# Patient Record
Sex: Female | Born: 1990 | Race: Black or African American | Hispanic: No | Marital: Single | Smoking: Never smoker
Health system: Southern US, Community
[De-identification: ages and names within clinical notes are randomized; demographics above are authoritative.]

## PROBLEM LIST (undated history)

## (undated) ENCOUNTER — Inpatient Hospital Stay (HOSPITAL_COMMUNITY): Payer: Self-pay

## (undated) DIAGNOSIS — Z789 Other specified health status: Secondary | ICD-10-CM

---

## 2018-01-07 ENCOUNTER — Encounter (HOSPITAL_COMMUNITY): Payer: Self-pay | Admitting: *Deleted

## 2018-01-07 ENCOUNTER — Inpatient Hospital Stay (HOSPITAL_COMMUNITY)
Admission: AD | Admit: 2018-01-07 | Discharge: 2018-01-07 | Disposition: A | Discharge status: Home / Self Care | Payer: Medicaid Other | Source: Ambulatory Visit | Attending: Obstetrics and Gynecology | Admitting: Obstetrics and Gynecology

## 2018-01-07 ENCOUNTER — Inpatient Hospital Stay (HOSPITAL_COMMUNITY): Payer: Medicaid Other

## 2018-01-07 DIAGNOSIS — O26893 Other specified pregnancy related conditions, third trimester: Secondary | ICD-10-CM | POA: Diagnosis present

## 2018-01-07 DIAGNOSIS — O4703 False labor before 37 completed weeks of gestation, third trimester: Secondary | ICD-10-CM

## 2018-01-07 DIAGNOSIS — R109 Unspecified abdominal pain: Secondary | ICD-10-CM | POA: Diagnosis present

## 2018-01-07 DIAGNOSIS — O26843 Uterine size-date discrepancy, third trimester: Secondary | ICD-10-CM

## 2018-01-07 DIAGNOSIS — Z3A31 31 weeks gestation of pregnancy: Secondary | ICD-10-CM | POA: Diagnosis not present

## 2018-01-07 DIAGNOSIS — O368390 Maternal care for abnormalities of the fetal heart rate or rhythm, unspecified trimester, not applicable or unspecified: Secondary | ICD-10-CM

## 2018-01-07 DIAGNOSIS — O479 False labor, unspecified: Secondary | ICD-10-CM

## 2018-01-07 HISTORY — DX: Other specified health status: Z78.9

## 2018-01-07 LAB — RAPID URINE DRUG SCREEN, HOSP PERFORMED
Amphetamines: NOT DETECTED
Benzodiazepines: NOT DETECTED
COCAINE: NOT DETECTED
OPIATES: NOT DETECTED
Tetrahydrocannabinol: NOT DETECTED

## 2018-01-07 LAB — URINALYSIS, ROUTINE W REFLEX MICROSCOPIC
BILIRUBIN URINE: NEGATIVE
Glucose, UA: NEGATIVE mg/dL
Hgb urine dipstick: NEGATIVE
KETONES UR: NEGATIVE mg/dL
NITRITE: NEGATIVE
Protein, ur: NEGATIVE mg/dL
SPECIFIC GRAVITY, URINE: 1.01 (ref 1.005–1.030)
pH: 7 (ref 5.0–8.0)

## 2018-01-07 NOTE — Discharge Instructions (Signed)
Braxton Hicks Contractions °Contractions of the uterus can occur throughout pregnancy, but they are not always a sign that you are in labor. You may have practice contractions called Braxton Hicks contractions. These false labor contractions are sometimes confused with true labor. °What are Braxton Hicks contractions? °Braxton Hicks contractions are tightening movements that occur in the muscles of the uterus before labor. Unlike true labor contractions, these contractions do not result in opening (dilation) and thinning of the cervix. Toward the end of pregnancy (32-34 weeks), Braxton Hicks contractions can happen more often and may become stronger. These contractions are sometimes difficult to tell apart from true labor because they can be very uncomfortable. You should not feel embarrassed if you go to the hospital with false labor. °Sometimes, the only way to tell if you are in true labor is for your health care provider to look for changes in the cervix. The health care provider will do a physical exam and may monitor your contractions. If you are not in true labor, the exam should show that your cervix is not dilating and your water has not broken. °If there are other health problems associated with your pregnancy, it is completely safe for you to be sent home with false labor. You may continue to have Braxton Hicks contractions until you go into true labor. °How to tell the difference between true labor and false labor °True labor °· Contractions last 30-70 seconds. °· Contractions become very regular. °· Discomfort is usually felt in the top of the uterus, and it spreads to the lower abdomen and low back. °· Contractions do not go away with walking. °· Contractions usually become more intense and increase in frequency. °· The cervix dilates and gets thinner. °False labor °· Contractions are usually shorter and not as strong as true labor contractions. °· Contractions are usually irregular. °· Contractions  are often felt in the front of the lower abdomen and in the groin. °· Contractions may go away when you walk around or change positions while lying down. °· Contractions get weaker and are shorter-lasting as time goes on. °· The cervix usually does not dilate or become thin. °Follow these instructions at home: °· Take over-the-counter and prescription medicines only as told by your health care provider. °· Keep up with your usual exercises and follow other instructions from your health care provider. °· Eat and drink lightly if you think you are going into labor. °· If Braxton Hicks contractions are making you uncomfortable: °? Change your position from lying down or resting to walking, or change from walking to resting. °? Sit and rest in a tub of warm water. °? Drink enough fluid to keep your urine pale yellow. Dehydration may cause these contractions. °? Do slow and deep breathing several times an hour. °· Keep all follow-up prenatal visits as told by your health care provider. This is important. °Contact a health care provider if: °· You have a fever. °· You have continuous pain in your abdomen. °Get help right away if: °· Your contractions become stronger, more regular, and closer together. °· You have fluid leaking or gushing from your vagina. °· You pass blood-tinged mucus (bloody show). °· You have bleeding from your vagina. °· You have low back pain that you never had before. °· You feel your baby’s head pushing down and causing pelvic pressure. °· Your baby is not moving inside you as much as it used to. °Summary °· Contractions that occur before labor are called Braxton   Hicks contractions, false labor, or practice contractions. °· Braxton Hicks contractions are usually shorter, weaker, farther apart, and less regular than true labor contractions. True labor contractions usually become progressively stronger and regular and they become more frequent. °· Manage discomfort from Braxton Hicks contractions by  changing position, resting in a warm bath, drinking plenty of water, or practicing deep breathing. °This information is not intended to replace advice given to you by your health care provider. Make sure you discuss any questions you have with your health care provider. °Document Released: 11/19/2016 Document Revised: 11/19/2016 Document Reviewed: 11/19/2016 °Elsevier Interactive Patient Education © 2018 [employer] ° °

## 2018-01-07 NOTE — MAU Note (Signed)
Pt C/O lower abdominal cramping since 1400, feel like needs to have BM but doesn't.  Denies bleeding or LOF.  Reports good fetal movement.

## 2018-01-07 NOTE — MAU Provider Note (Signed)
History     CSN: [id number]  Arrival date and time: 01/07/18 1640   First Provider Initiated Contact with Patient 01/07/18 1822      Chief Complaint  Patient presents with  . Abdominal Pain   HPI   [name] is a 27 y.o. female G2P1001 @ 2884w3d here in MAU with contractions. Says the contractions started today around 1400. Says she is feeling the contractions every 10 minutes. No bleeding or discharge. Says she has not taken anything for the pain. Says the contraction pain is 6-7/10. + active fetal movement. NO complications with this pregnancy. Getting her care at [facility], however plans to deliver in [place].   OB History    Gravida  2   Para  1   Term  1   Preterm      AB      Living  1     SAB      TAB      Ectopic      Multiple      Live Births              Past Medical History:  Diagnosis Date  . Medical history non-contributory     Past Surgical History:  Procedure Laterality Date  . CESAREAN SECTION      Family History  Problem Relation Age of Onset  . Diabetes Father   . Diabetes Maternal Aunt     Social History   Tobacco Use  . Smoking status: Never Smoker  . Smokeless tobacco: Never Used  Substance Use Topics  . Alcohol use: Never    Frequency: Never  . Drug use: Never    Allergies: No Known Allergies  No medications prior to admission.   Results for orders placed or performed during the hospital encounter of 01/07/18 (from the past 48 hour(s))  Urinalysis, Routine w reflex microscopic     Status: Abnormal   Collection Time: 01/07/18  5:10 PM  Result Value Ref Range   Color, Urine YELLOW YELLOW   APPearance HAZY (A) CLEAR   Specific Gravity, Urine 1.010 1.005 - 1.030   pH 7.0 5.0 - 8.0   Glucose, UA NEGATIVE NEGATIVE mg/dL   Hgb urine dipstick NEGATIVE NEGATIVE   Bilirubin Urine NEGATIVE NEGATIVE   Ketones, ur NEGATIVE NEGATIVE mg/dL   Protein, ur NEGATIVE NEGATIVE mg/dL   Nitrite NEGATIVE NEGATIVE   Leukocytes,  UA SMALL (A) NEGATIVE   RBC / HPF 0-5 0 - 5 RBC/hpf   WBC, UA 0-5 0 - 5 WBC/hpf   Bacteria, UA RARE (A) NONE SEEN   Squamous Epithelial / LPF 11-20 0 - 5    Comment: Performed at [facility], [address] [place], [place] [id number]  Urine rapid drug screen (hosp performed)     Status: Abnormal   Collection Time: 01/07/18  5:21 PM  Result Value Ref Range   Opiates NONE DETECTED NONE DETECTED   Cocaine NONE DETECTED NONE DETECTED   Benzodiazepines NONE DETECTED NONE DETECTED   Amphetamines NONE DETECTED NONE DETECTED   Tetrahydrocannabinol NONE DETECTED NONE DETECTED   Barbiturates (A) NONE DETECTED    Result not available. Reagent lot number recalled by manufacturer.    Comment: (NOTE) DRUG SCREEN FOR MEDICAL PURPOSES ONLY.  IF CONFIRMATION IS NEEDED FOR ANY PURPOSE, NOTIFY LAB WITHIN 5 DAYS. LOWEST DETECTABLE LIMITS FOR URINE DRUG SCREEN Drug Class  Cutoff (ng/mL) Amphetamine and metabolites    1000 Barbiturate and metabolites    200 Benzodiazepine                 200 Tricyclics and metabolites     300 Opiates and metabolites        300 Cocaine and metabolites        300 THC                            50 Performed at [facility], [address] [place], [place] [address]     Review of Systems  Constitutional: Negative for fever.  Gastrointestinal: Positive for abdominal pain.  Genitourinary: Negative for decreased urine volume, dysuria, vaginal bleeding and vaginal discharge.   Physical Exam   Blood pressure (!) 100/56, pulse 82, temperature 98.6 F (37 C), temperature source Oral, resp. rate 18, height 5\' 9"  (1.753 m), weight 168 lb (76.2 kg).  Physical Exam  Constitutional: She is oriented to person, place, and time. She appears well-developed and well-nourished. No distress.  HENT:  Head: Normocephalic.  Eyes: Pupils are equal, round, and reactive to light.  GI: Soft. She exhibits no distension. There is no tenderness. There is no  rebound and no guarding.  Genitourinary:  Genitourinary Comments: Cervix: closed, thick posterior, external os open, internal os closed  Musculoskeletal: Normal range of motion.  Neurological: She is alert and oriented to person, place, and time.  Skin: Skin is warm. She is not diaphoretic.  Psychiatric: Her behavior is normal.   Fetal Tracing: Baseline: Difficult to determine  Variability: Difficult to determine  Accelerations: Difficult to determine  Decelerations: None Toco: None   MAU Course  Procedures  None  MDM  Limited OB US UA Urine drug screen  Dr. Emelda Fear at bedside; bedside US done by Dr. Emelda Fear,  Irregularly irregular skipped beats in the fetal heart rate. Fetal heart rate by bedside US measured for 15 seconds at a time, 2 skipped beats noted per 15 seconds/8 per minuet.   Assessment and Plan   A:  1. Braxton Hicks contractions   2. [redacted] weeks gestation of pregnancy   3. Size of fetus inconsistent with dates in third trimester   4. Fetal arrhythmia affecting pregnancy, antepartum     P:  Discharge home in stable condition Close follow up with OB Return to MAU if symptoms worsen Pre-term labor precautions  Rasch, Harolyn Rutherford, NP 01/09/2018 8:57 PM

## 2018-05-31 ENCOUNTER — Encounter (HOSPITAL_COMMUNITY): Payer: Self-pay

## 2019-06-17 IMAGING — US US MFM OB LIMITED
1 series · 15 of 26 positions shown · non-contrast
Comparison: none

[Series 1: us mfm ob limited · 15 of 26 slices shown]
[im 1/26]
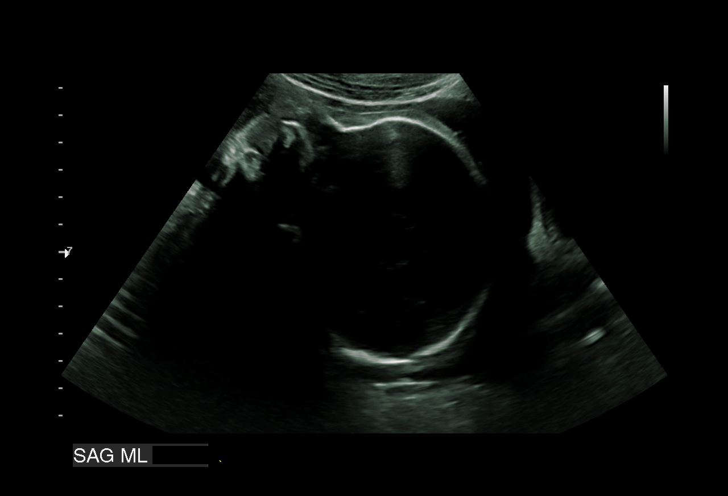
[im 3/26]
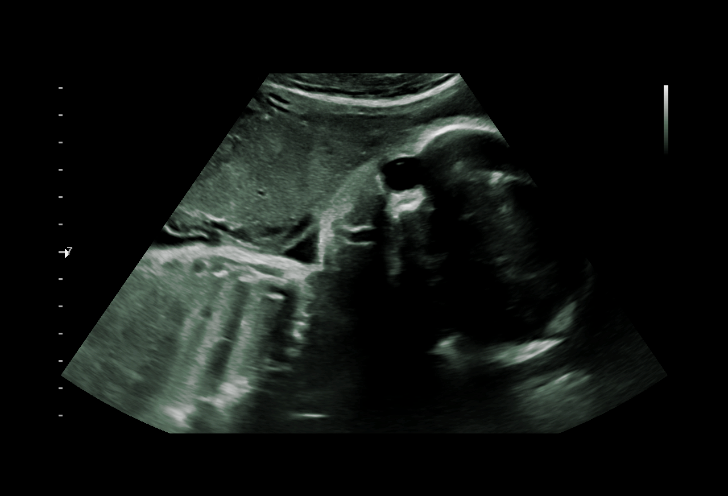
[im 5/26]
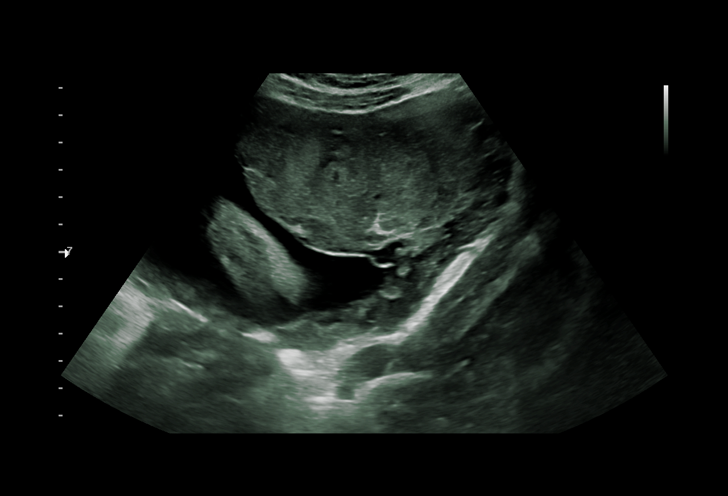
[im 7/26]
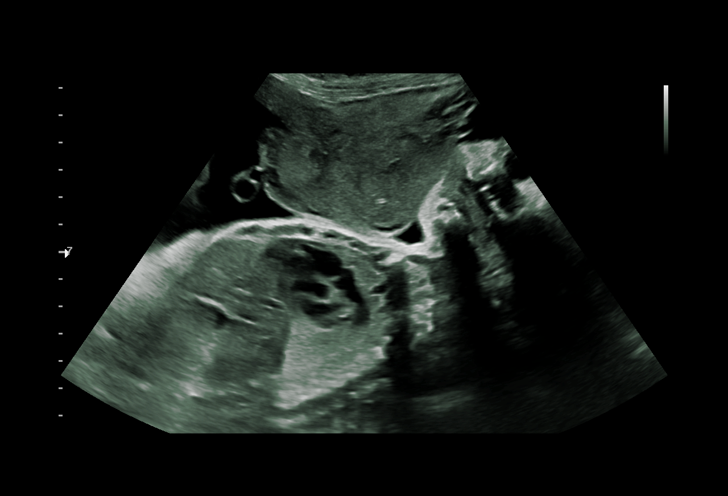
[im 8/26]
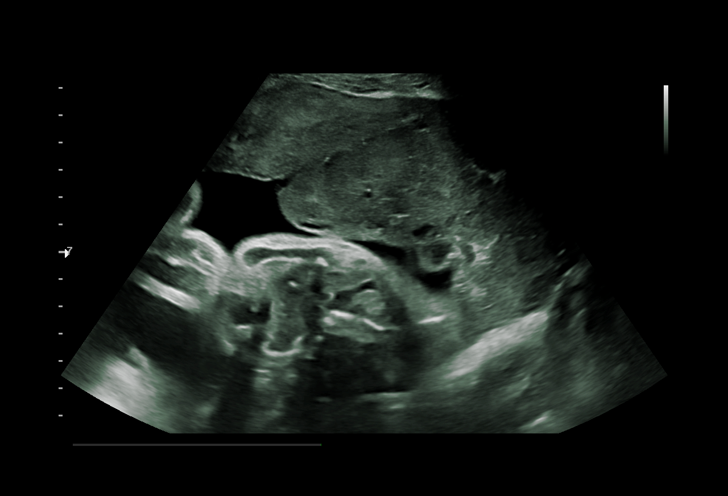
[im 10/26]
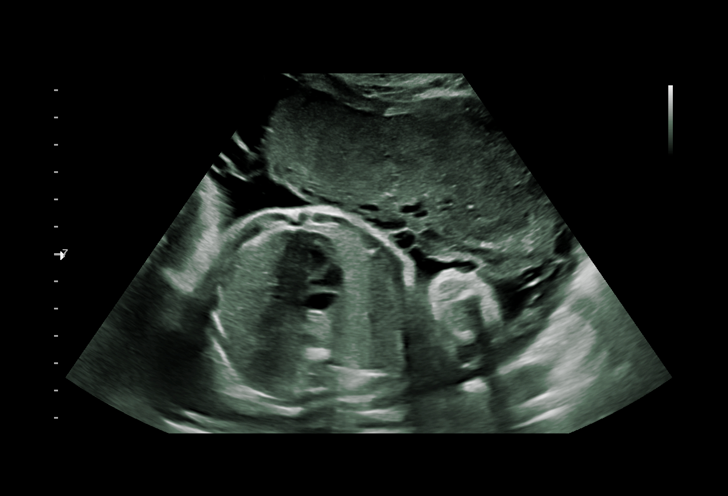
[im 12/26]
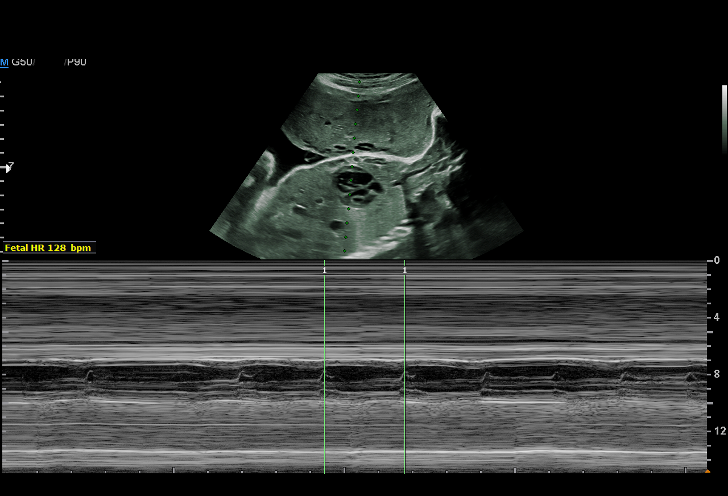
[im 14/26]
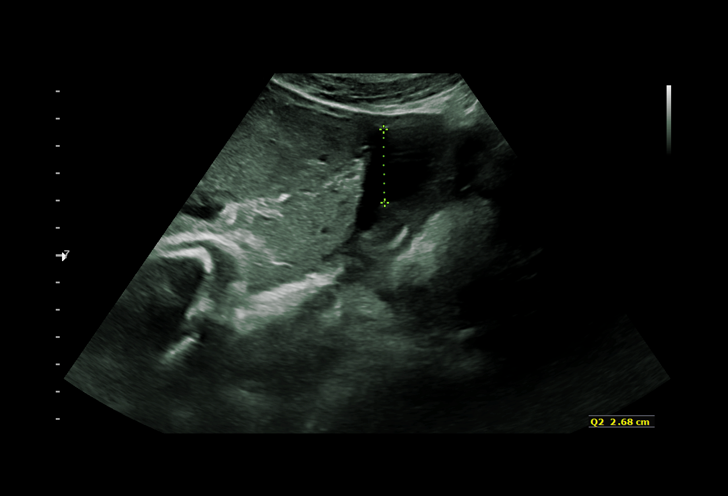
[im 15/26]
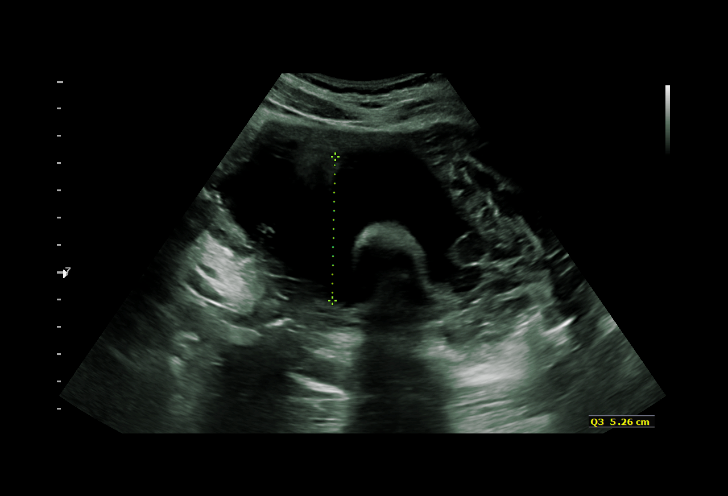
[im 17/26]
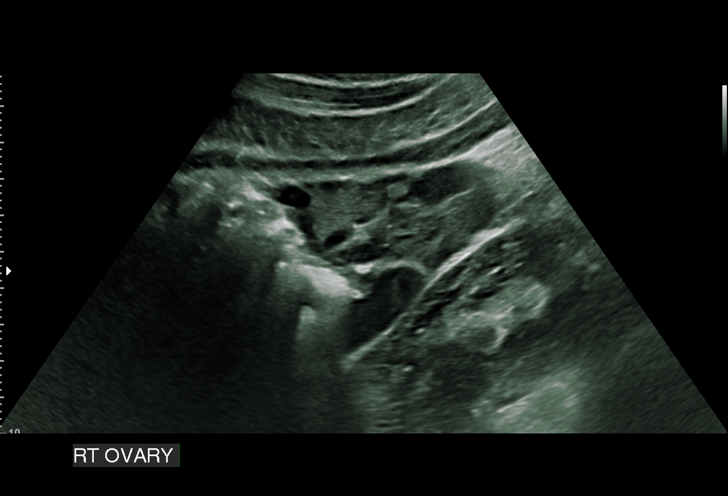
[im 19/26]
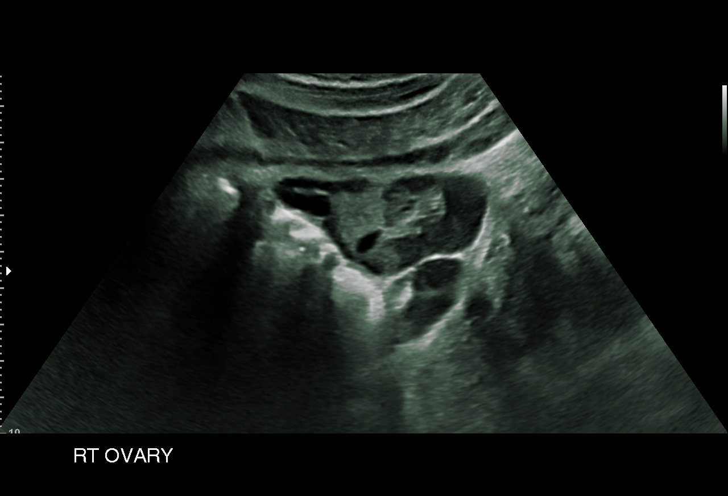
[im 20/26]
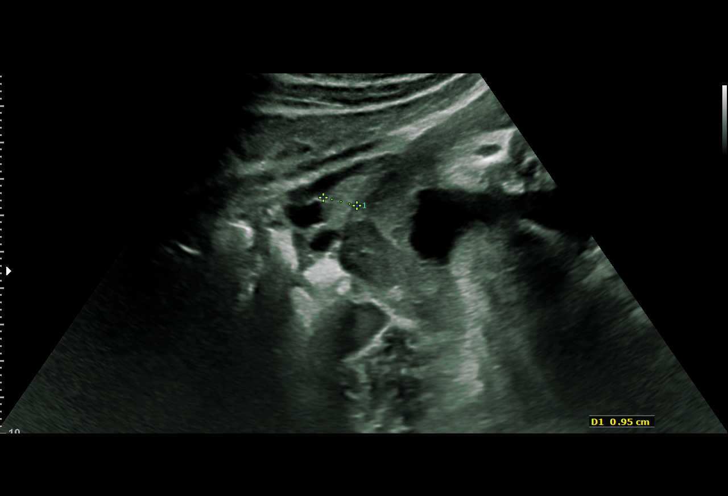
[im 22/26]
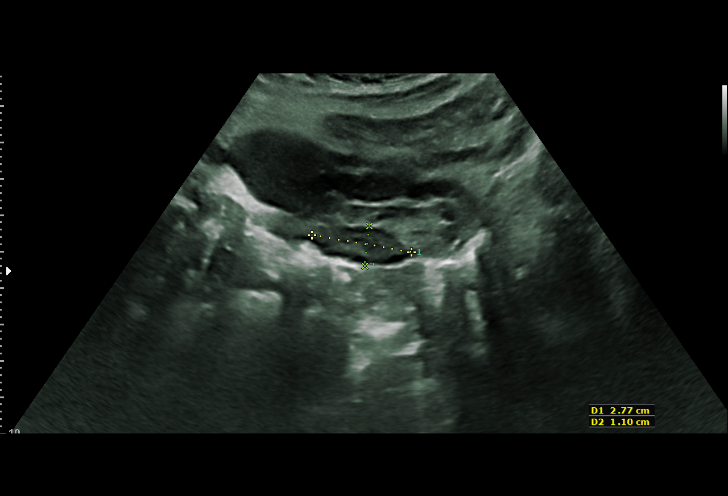
[im 24/26]
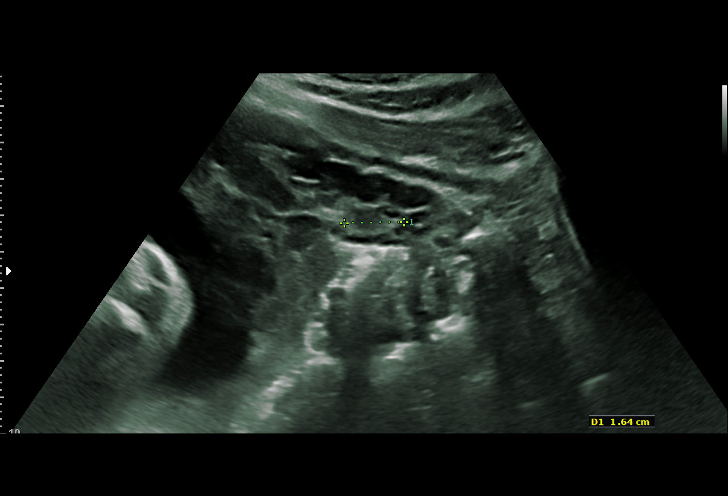
[im 26/26]
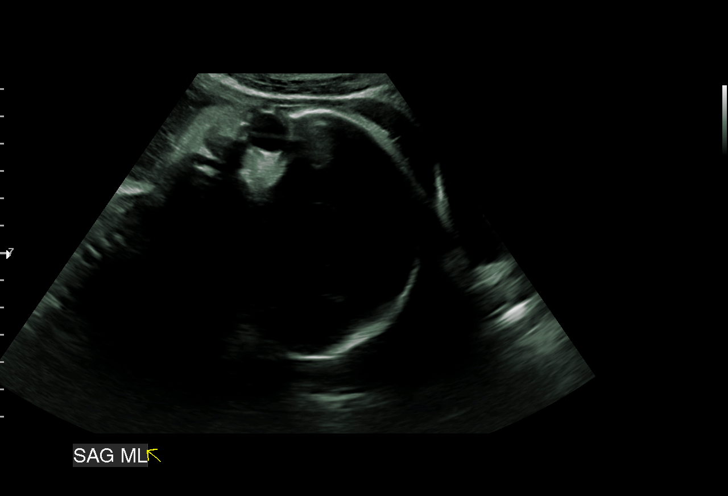

[15 of 26 positions shown; findings below may reference images not displayed]

1  KLEUTON BELAS           [id number]      [id number]     [id number]
Indications

31 weeks gestation of pregnancy
Pelvic pain affecting pregnancy in third
trimester
OB History

Gravidity:    2         Term:   1
Living:       1
Fetal Evaluation

Num Of Fetuses:     1
Fetal Heart         128
Rate(bpm):
Cardiac Activity:   Observed
Presentation:       Cephalic
Placenta:           Anterior, LT above cervical os

Amniotic Fluid
AFI FV:      Subjectively within normal limits

AFI Sum(cm)     %Tile       Largest Pocket(cm)
17.12           63

RUQ(cm)       RLQ(cm)       LUQ(cm)        LLQ(cm)
4.06
Gestational Age

Clinical EDD:  31w 3d                                        EDD:   03/08/18
Best:          31w 3d     Det. By:  Clinical EDD             EDD:   03/08/18
Cervix Uterus Adnexa
Cervix
Not visualized (advanced GA >74wks)

Left Ovary
Within normal limits.

Right Ovary
Within normal limits.
Impression

Single living intrauterine pregnancy at 31w 3d (remote read of
images only--limited ultrasound as per request)
Cephalic presentation.
Placenta Anterior, LT above cervical os.
Normal amniotic fluid volume.
Recommendations

Clinical correlation is recommended. Follow up as clinically
indicated.

## 2020-04-26 ENCOUNTER — Encounter (HOSPITAL_COMMUNITY): Payer: Self-pay | Admitting: Emergency Medicine

## 2020-04-26 ENCOUNTER — Other Ambulatory Visit: Payer: Self-pay

## 2020-04-26 ENCOUNTER — Ambulatory Visit (HOSPITAL_COMMUNITY)
Admission: EM | Admit: 2020-04-26 | Discharge: 2020-04-26 | Disposition: A | Discharge status: Home / Self Care | Payer: Medicaid Other | Attending: Internal Medicine | Admitting: Internal Medicine

## 2020-04-26 DIAGNOSIS — I9589 Other hypotension: Secondary | ICD-10-CM

## 2020-04-26 DIAGNOSIS — S0990XA Unspecified injury of head, initial encounter: Secondary | ICD-10-CM

## 2020-04-26 DIAGNOSIS — S060X0A Concussion without loss of consciousness, initial encounter: Secondary | ICD-10-CM

## 2020-04-26 MED ORDER — KETOROLAC TROMETHAMINE 30 MG/ML IJ SOLN
INTRAMUSCULAR | Status: AC
  Filled 2020-04-26: qty 1

## 2020-04-26 MED ORDER — KETOROLAC TROMETHAMINE 30 MG/ML IJ SOLN
30.0000 mg | Freq: Once | INTRAMUSCULAR | Status: AC
  Administered 2020-04-26: 30 mg via INTRAMUSCULAR
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
## 2020-04-26 NOTE — Discharge Instructions (Addendum)
Please go to the ED for further evaluation. You will need to have CT head to evaluate you for possible brain injury.
